# Patient Record
Sex: Female | Born: 1985 | Race: Black or African American | Hispanic: No | Marital: Single | Smoking: Never smoker
Health system: Southern US, Community
[De-identification: ages and names within clinical notes are randomized; demographics above are authoritative.]

## PROBLEM LIST (undated history)

## (undated) DIAGNOSIS — R51 Headache: Secondary | ICD-10-CM

## (undated) DIAGNOSIS — K649 Unspecified hemorrhoids: Secondary | ICD-10-CM

## (undated) DIAGNOSIS — R011 Cardiac murmur, unspecified: Secondary | ICD-10-CM

## (undated) DIAGNOSIS — B9689 Other specified bacterial agents as the cause of diseases classified elsewhere: Secondary | ICD-10-CM

## (undated) DIAGNOSIS — R569 Unspecified convulsions: Secondary | ICD-10-CM

## (undated) DIAGNOSIS — N760 Acute vaginitis: Secondary | ICD-10-CM

## (undated) DIAGNOSIS — N611 Abscess of the breast and nipple: Secondary | ICD-10-CM

## (undated) HISTORY — DX: Unspecified convulsions: R56.9

## (undated) HISTORY — PX: INCISE AND DRAIN ABCESS: PRO64

## (undated) HISTORY — PX: WISDOM TOOTH EXTRACTION: SHX21

## (undated) HISTORY — DX: Abscess of the breast and nipple: N61.1

## (undated) HISTORY — DX: Cardiac murmur, unspecified: R01.1

## (undated) HISTORY — PX: BREAST EXCISIONAL BIOPSY: SUR124

---
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
## 2004-03-16 ENCOUNTER — Encounter: Admission: RE | Admit: 2004-03-16 | Discharge: 2004-03-16 | Payer: Self-pay | Admitting: Family Medicine

## 2004-04-05 ENCOUNTER — Encounter (INDEPENDENT_AMBULATORY_CARE_PROVIDER_SITE_OTHER): Payer: Self-pay | Admitting: *Deleted

## 2004-04-05 LAB — CONVERTED CEMR LAB

## 2004-04-25 ENCOUNTER — Other Ambulatory Visit: Admission: RE | Admit: 2004-04-25 | Discharge: 2004-04-25 | Payer: Self-pay | Admitting: Family Medicine

## 2004-04-25 ENCOUNTER — Ambulatory Visit: Payer: Self-pay | Admitting: Family Medicine

## 2004-04-25 ENCOUNTER — Encounter (INDEPENDENT_AMBULATORY_CARE_PROVIDER_SITE_OTHER): Payer: Self-pay | Admitting: Specialist

## 2004-04-30 ENCOUNTER — Encounter: Admission: RE | Admit: 2004-04-30 | Discharge: 2004-04-30 | Payer: Self-pay | Admitting: Sports Medicine

## 2004-11-20 ENCOUNTER — Emergency Department (HOSPITAL_COMMUNITY): Admission: EM | Admit: 2004-11-20 | Discharge: 2004-11-20 | Payer: Self-pay | Admitting: Family Medicine

## 2005-03-01 ENCOUNTER — Ambulatory Visit: Payer: Self-pay | Admitting: Sports Medicine

## 2005-06-30 ENCOUNTER — Emergency Department (HOSPITAL_COMMUNITY): Admission: EM | Admit: 2005-06-30 | Discharge: 2005-06-30 | Payer: Self-pay | Admitting: Family Medicine

## 2006-04-02 ENCOUNTER — Emergency Department (HOSPITAL_COMMUNITY): Admission: EM | Admit: 2006-04-02 | Discharge: 2006-04-02 | Payer: Self-pay | Admitting: Family Medicine

## 2006-06-25 ENCOUNTER — Emergency Department (HOSPITAL_COMMUNITY): Admission: EM | Admit: 2006-06-25 | Discharge: 2006-06-25 | Payer: Self-pay | Admitting: Family Medicine

## 2006-07-07 ENCOUNTER — Inpatient Hospital Stay (HOSPITAL_COMMUNITY): Admission: AD | Admit: 2006-07-07 | Discharge: 2006-07-07 | Payer: Self-pay | Admitting: Obstetrics & Gynecology

## 2006-10-02 DIAGNOSIS — G44209 Tension-type headache, unspecified, not intractable: Secondary | ICD-10-CM

## 2006-10-02 DIAGNOSIS — N751 Abscess of Bartholin's gland: Secondary | ICD-10-CM

## 2006-10-02 DIAGNOSIS — N879 Dysplasia of cervix uteri, unspecified: Secondary | ICD-10-CM | POA: Insufficient documentation

## 2006-10-02 DIAGNOSIS — H1045 Other chronic allergic conjunctivitis: Secondary | ICD-10-CM | POA: Insufficient documentation

## 2006-10-03 ENCOUNTER — Encounter (INDEPENDENT_AMBULATORY_CARE_PROVIDER_SITE_OTHER): Payer: Self-pay | Admitting: *Deleted

## 2007-03-15 ENCOUNTER — Emergency Department (HOSPITAL_COMMUNITY): Admission: EM | Admit: 2007-03-15 | Discharge: 2007-03-15 | Payer: Self-pay | Admitting: Emergency Medicine

## 2007-04-22 ENCOUNTER — Ambulatory Visit (HOSPITAL_COMMUNITY): Admission: RE | Admit: 2007-04-22 | Discharge: 2007-04-22 | Payer: Self-pay | Admitting: Internal Medicine

## 2007-06-16 ENCOUNTER — Emergency Department (HOSPITAL_COMMUNITY): Admission: EM | Admit: 2007-06-16 | Discharge: 2007-06-16 | Payer: Self-pay | Admitting: Emergency Medicine

## 2007-09-27 ENCOUNTER — Emergency Department (HOSPITAL_COMMUNITY): Admission: EM | Admit: 2007-09-27 | Discharge: 2007-09-27 | Payer: Self-pay | Admitting: Family Medicine

## 2007-11-09 ENCOUNTER — Emergency Department (HOSPITAL_COMMUNITY): Admission: EM | Admit: 2007-11-09 | Discharge: 2007-11-09 | Payer: Self-pay | Admitting: Emergency Medicine

## 2007-11-26 ENCOUNTER — Emergency Department (HOSPITAL_COMMUNITY): Admission: EM | Admit: 2007-11-26 | Discharge: 2007-11-26 | Payer: Self-pay | Admitting: Emergency Medicine

## 2008-01-12 ENCOUNTER — Emergency Department (HOSPITAL_COMMUNITY): Admission: EM | Admit: 2008-01-12 | Discharge: 2008-01-12 | Payer: Self-pay | Admitting: Family Medicine

## 2008-05-16 ENCOUNTER — Emergency Department (HOSPITAL_COMMUNITY): Admission: EM | Admit: 2008-05-16 | Discharge: 2008-05-16 | Payer: Self-pay | Admitting: Emergency Medicine

## 2008-07-24 ENCOUNTER — Emergency Department (HOSPITAL_COMMUNITY): Admission: EM | Admit: 2008-07-24 | Discharge: 2008-07-24 | Payer: Self-pay | Admitting: Family Medicine

## 2008-09-02 ENCOUNTER — Emergency Department (HOSPITAL_COMMUNITY): Admission: EM | Admit: 2008-09-02 | Discharge: 2008-09-02 | Payer: Self-pay | Admitting: Family Medicine

## 2009-02-17 ENCOUNTER — Emergency Department (HOSPITAL_COMMUNITY): Admission: EM | Admit: 2009-02-17 | Discharge: 2009-02-17 | Payer: Self-pay | Admitting: Emergency Medicine

## 2009-03-21 ENCOUNTER — Emergency Department (HOSPITAL_COMMUNITY): Admission: EM | Admit: 2009-03-21 | Discharge: 2009-03-21 | Payer: Self-pay | Admitting: Emergency Medicine

## 2010-01-30 ENCOUNTER — Emergency Department (HOSPITAL_COMMUNITY): Admission: EM | Admit: 2010-01-30 | Discharge: 2010-01-30 | Payer: Self-pay | Admitting: Emergency Medicine

## 2010-03-20 ENCOUNTER — Emergency Department (HOSPITAL_COMMUNITY): Admission: EM | Admit: 2010-03-20 | Discharge: 2010-03-20 | Payer: Self-pay | Admitting: Family Medicine

## 2010-03-26 ENCOUNTER — Emergency Department (HOSPITAL_COMMUNITY): Admission: EM | Admit: 2010-03-26 | Discharge: 2010-03-26 | Payer: Self-pay | Admitting: Emergency Medicine

## 2010-10-18 LAB — POCT URINALYSIS DIPSTICK
Glucose, UA: NEGATIVE mg/dL
Ketones, ur: NEGATIVE mg/dL
Protein, ur: 100 mg/dL — AB
Specific Gravity, Urine: 1.02 (ref 1.005–1.030)
Urobilinogen, UA: 0.2 mg/dL (ref 0.0–1.0)
pH: 6 (ref 5.0–8.0)

## 2010-10-18 LAB — GC/CHLAMYDIA PROBE AMP, GENITAL
Chlamydia, DNA Probe: NEGATIVE
GC Probe Amp, Genital: NEGATIVE

## 2010-11-10 LAB — WET PREP, GENITAL
Trich, Wet Prep: NONE SEEN
Yeast Wet Prep HPF POC: NONE SEEN

## 2010-11-10 LAB — GC/CHLAMYDIA PROBE AMP, GENITAL
Chlamydia, DNA Probe: NEGATIVE
GC Probe Amp, Genital: NEGATIVE

## 2010-11-10 LAB — POCT PREGNANCY, URINE: Preg Test, Ur: NEGATIVE

## 2010-11-10 LAB — POCT URINALYSIS DIP (DEVICE): Glucose, UA: NEGATIVE mg/dL

## 2010-11-11 LAB — WET PREP, GENITAL: Yeast Wet Prep HPF POC: NONE SEEN

## 2010-11-11 LAB — POCT URINALYSIS DIP (DEVICE)
Bilirubin Urine: NEGATIVE
Hgb urine dipstick: NEGATIVE
Specific Gravity, Urine: >=1.03 (ref 1.005–1.030)
Urobilinogen, UA: 1 mg/dL (ref 0.0–1.0)
pH: 5.5 (ref 5.0–8.0)

## 2010-11-19 LAB — WET PREP, GENITAL: Trich, Wet Prep: NONE SEEN

## 2010-11-19 LAB — POCT URINALYSIS DIP (DEVICE)
Bilirubin Urine: NEGATIVE
Hgb urine dipstick: NEGATIVE
Urobilinogen, UA: 0.2 mg/dL (ref 0.0–1.0)
pH: 5.5 (ref 5.0–8.0)

## 2011-04-26 LAB — WET PREP, GENITAL
Trich, Wet Prep: NONE SEEN
Yeast Wet Prep HPF POC: NONE SEEN

## 2011-04-26 LAB — POCT URINALYSIS DIP (DEVICE)
Nitrite: NEGATIVE
Operator id: 235561
Protein, ur: 30 — AB

## 2011-04-26 LAB — POCT PREGNANCY, URINE
Operator id: 235561
Preg Test, Ur: NEGATIVE

## 2011-04-26 LAB — GC/CHLAMYDIA PROBE AMP, GENITAL: GC Probe Amp, Genital: NEGATIVE

## 2011-04-30 LAB — POCT PREGNANCY, URINE
Operator id: 126491
Operator id: 239701
Preg Test, Ur: NEGATIVE

## 2011-04-30 LAB — POCT URINALYSIS DIP (DEVICE)
Bilirubin Urine: NEGATIVE
Glucose, UA: NEGATIVE
Glucose, UA: NEGATIVE
Ketones, ur: NEGATIVE
Protein, ur: NEGATIVE
Specific Gravity, Urine: 1.02
Urobilinogen, UA: 1
pH: 6

## 2011-04-30 LAB — GC/CHLAMYDIA PROBE AMP, GENITAL: Chlamydia, DNA Probe: NEGATIVE

## 2011-04-30 LAB — WET PREP, GENITAL: Trich, Wet Prep: NONE SEEN

## 2011-04-30 LAB — URINE CULTURE: Colony Count: >=100000

## 2011-05-02 LAB — POCT PREGNANCY, URINE
Operator id: 247071
Preg Test, Ur: NEGATIVE

## 2011-05-02 LAB — POCT URINALYSIS DIP (DEVICE)
Glucose, UA: NEGATIVE
Nitrite: NEGATIVE
Operator id: 247071
Protein, ur: NEGATIVE
Specific Gravity, Urine: 1.025
Urobilinogen, UA: 0.2

## 2011-05-02 LAB — WET PREP, GENITAL
Trich, Wet Prep: NONE SEEN
Yeast Wet Prep HPF POC: NONE SEEN

## 2011-05-06 LAB — WET PREP, GENITAL: Yeast Wet Prep HPF POC: NONE SEEN

## 2011-05-06 LAB — POCT URINALYSIS DIP (DEVICE)
Bilirubin Urine: NEGATIVE
Glucose, UA: NEGATIVE
Hgb urine dipstick: NEGATIVE
Ketones, ur: 40 — AB
Operator id: 235561
Specific Gravity, Urine: >=1.03

## 2011-05-06 LAB — GC/CHLAMYDIA PROBE AMP, GENITAL
Chlamydia, DNA Probe: NEGATIVE
GC Probe Amp, Genital: POSITIVE — AB

## 2011-05-09 LAB — POCT URINALYSIS DIP (DEVICE)
Hgb urine dipstick: NEGATIVE
Ketones, ur: NEGATIVE mg/dL
Nitrite: NEGATIVE
Protein, ur: 30 mg/dL — AB
pH: 5.5 (ref 5.0–8.0)

## 2011-05-09 LAB — URINE CULTURE: Colony Count: 45000

## 2011-05-09 LAB — GC/CHLAMYDIA PROBE AMP, GENITAL
Chlamydia, DNA Probe: NEGATIVE
GC Probe Amp, Genital: NEGATIVE

## 2011-05-09 LAB — WET PREP, GENITAL: Yeast Wet Prep HPF POC: NONE SEEN

## 2011-05-14 LAB — WET PREP, GENITAL
Trich, Wet Prep: NONE SEEN
Yeast Wet Prep HPF POC: NONE SEEN

## 2011-05-14 LAB — GC/CHLAMYDIA PROBE AMP, GENITAL
Chlamydia, DNA Probe: NEGATIVE
GC Probe Amp, Genital: NEGATIVE

## 2011-05-20 LAB — POCT URINALYSIS DIP (DEVICE)
Hgb urine dipstick: NEGATIVE
Operator id: 116291
Protein, ur: 100 — AB
Specific Gravity, Urine: >=1.03
Urobilinogen, UA: 1
pH: 6

## 2011-05-20 LAB — WET PREP, GENITAL

## 2011-05-20 LAB — GC/CHLAMYDIA PROBE AMP, GENITAL: GC Probe Amp, Genital: NEGATIVE

## 2012-03-26 ENCOUNTER — Encounter (HOSPITAL_COMMUNITY): Payer: Self-pay | Admitting: *Deleted

## 2012-03-26 ENCOUNTER — Encounter (INDEPENDENT_AMBULATORY_CARE_PROVIDER_SITE_OTHER): Payer: Self-pay | Admitting: Surgery

## 2012-03-26 ENCOUNTER — Ambulatory Visit (INDEPENDENT_AMBULATORY_CARE_PROVIDER_SITE_OTHER): Payer: Managed Care, Other (non HMO) | Admitting: Surgery

## 2012-03-26 VITALS — BP 130/84 | HR 90 | Temp 98.9°F | Ht 67.0 in | Wt 184.0 lb

## 2012-03-26 DIAGNOSIS — N61 Mastitis without abscess: Secondary | ICD-10-CM

## 2012-03-26 DIAGNOSIS — N611 Abscess of the breast and nipple: Secondary | ICD-10-CM | POA: Insufficient documentation

## 2012-03-26 NOTE — Progress Notes (Signed)
NAMEFIONNA Martin DOB: [birth year] MRN: [id number]                                                                                      DATE: 03/26/2012  PCP:  Referring Provider: [facility]  IMPRESSION:  Right breast abscess  PLAN:   Incision and drainage under anesthesia tomorrow. I offered to do this under local anesthesia in the office today but the patient adamantly declined. Therefore we will do it tomorrow under anesthesia.                 CC:  Chief Complaint  Patient presents with  . Abscess    breast     HPI:  Jean Martin is a 26 y.o.  female who presents for evaluation of A right breast abscess. She thinks it's been developing since Monday. She has had abscesses drained on her thigh and on her pilonidal area all under local in all very painful to her.She started on some Septra antibiotic and thinks perhaps as little bit better and has spontaneously drained a small amount. PMH:  has a past medical history of Seizures and Heart murmur.  PSH:   has no past surgical history on file.  ALLERGIES:  No Known Allergies  MEDICATIONS: Current outpatient prescriptions:sulfamethoxazole-trimethoprim (BACTRIM DS) 800-160 MG per tablet, Take 1 tablet by mouth 2 (two) times daily., Disp: , Rfl:   ROS: She has filled out our 12 point review of systems and it is negative Except for as noted in history of present illness. EXAM:   VITAL SIGNS: BP 130/84  Pulse 90  Temp 98.9 F (37.2 C) (Temporal)  Ht 5\' 7"  (1.702 m)  Wt 184 lb (83.462 kg)  BMI 28.82 kg/m2 GENERAL:  The patient is alert, oriented, and generally healthy-appearing, NAD. Mood and affect are normal.  HEENT:  The head is normocephalic, the eyes nonicteric, the pupils were round regular and equal. EOMs are normal. Pharynx normal. Dentition good.  NECK:  The neck is supple and there are no masses or thyromegaly.  LUNGS: Normal respirations and clear to auscultation.  HEART: Regular rhythm, with no  murmurs rubs or gallops. Pulses are intact carotid dorsalis pedis and posterior tibial. No significant varicosities are noted.  BREASTS:  The right breast has a fluctuant area at about the 6:00 position. There some purulent material draining. The area of fluctuance measures about 2.5 cm. There is overlying erythema and some Epidermolysis.  ABDOMEN: Soft, flat, and nontender. No masses or organomegaly is noted. No hernias are noted. Bowel sounds are normal.  EXTREMITIES:  Good range of motion, no edema.   DATA REVIEWED:  None    Aileana Hodder J 03/26/2012  CC: [facility]

## 2012-03-26 NOTE — Patient Instructions (Signed)
We will plan to drain the abscess tomorrow in the operating room under anesthesia. You will need someone to take you home and stay overnight with you

## 2012-03-27 ENCOUNTER — Ambulatory Visit (HOSPITAL_COMMUNITY): Payer: Managed Care, Other (non HMO) | Admitting: Anesthesiology

## 2012-03-27 ENCOUNTER — Encounter (HOSPITAL_COMMUNITY)
Admission: RE | Disposition: A | Discharge status: Home / Self Care | Payer: Self-pay | Source: Ambulatory Visit | Attending: Surgery

## 2012-03-27 ENCOUNTER — Encounter (HOSPITAL_COMMUNITY): Payer: Self-pay | Admitting: *Deleted

## 2012-03-27 ENCOUNTER — Encounter (HOSPITAL_COMMUNITY): Payer: Self-pay | Admitting: Anesthesiology

## 2012-03-27 ENCOUNTER — Ambulatory Visit (HOSPITAL_COMMUNITY)
Admission: RE | Admit: 2012-03-27 | Discharge: 2012-03-27 | Disposition: A | Discharge status: Home / Self Care | Payer: Managed Care, Other (non HMO) | Source: Ambulatory Visit | Attending: Surgery | Admitting: Surgery

## 2012-03-27 DIAGNOSIS — N61 Mastitis without abscess: Secondary | ICD-10-CM | POA: Insufficient documentation

## 2012-03-27 DIAGNOSIS — R51 Headache: Secondary | ICD-10-CM | POA: Insufficient documentation

## 2012-03-27 DIAGNOSIS — R569 Unspecified convulsions: Secondary | ICD-10-CM | POA: Insufficient documentation

## 2012-03-27 HISTORY — DX: Unspecified hemorrhoids: K64.9

## 2012-03-27 HISTORY — DX: Headache: R51

## 2012-03-27 LAB — SURGICAL PCR SCREEN
MRSA, PCR: NEGATIVE
Staphylococcus aureus: NEGATIVE

## 2012-03-27 LAB — CBC
HCT: 36.4 % (ref 36.0–46.0)
Hemoglobin: 12.3 g/dL (ref 12.0–15.0)
MCHC: 33.8 g/dL (ref 30.0–36.0)
RDW: 12.6 % (ref 11.5–15.5)
WBC: 10 10*3/uL (ref 4.0–10.5)

## 2012-03-27 LAB — HCG, SERUM, QUALITATIVE: Preg, Serum: NEGATIVE

## 2012-03-27 SURGERY — INCISION AND DRAINAGE, ABSCESS
Anesthesia: Choice | Laterality: Right | Wound class: Dirty or Infected

## 2012-03-27 MED ORDER — BUPIVACAINE HCL (PF) 0.25 % IJ SOLN
INTRAMUSCULAR | Status: AC
  Filled 2012-03-27: qty 30

## 2012-03-27 MED ORDER — CEFAZOLIN SODIUM-DEXTROSE 2-3 GM-% IV SOLR
INTRAVENOUS | Status: AC
  Administered 2012-03-27: 2 g via INTRAVENOUS
  Filled 2012-03-27: qty 50

## 2012-03-27 MED ORDER — BUPIVACAINE-EPINEPHRINE 0.25% -1:200000 IJ SOLN
INTRAMUSCULAR | Status: DC | PRN
  Administered 2012-03-27: 10 mL

## 2012-03-27 MED ORDER — HYDROCODONE-ACETAMINOPHEN 5-325 MG PO TABS
ORAL_TABLET | ORAL | Status: AC
  Filled 2012-03-27: qty 1

## 2012-03-27 MED ORDER — CEFAZOLIN SODIUM-DEXTROSE 2-3 GM-% IV SOLR: 2.0000 g | INTRAVENOUS | Status: DC

## 2012-03-27 MED ORDER — ONDANSETRON HCL 4 MG/2ML IJ SOLN
INTRAMUSCULAR | Status: DC | PRN
  Administered 2012-03-27: 4 mg via INTRAVENOUS

## 2012-03-27 MED ORDER — LIDOCAINE HCL (CARDIAC) 20 MG/ML IV SOLN
INTRAVENOUS | Status: DC | PRN
  Administered 2012-03-27: 50 mg via INTRAVENOUS

## 2012-03-27 MED ORDER — HYDROCODONE-ACETAMINOPHEN 5-325 MG PO TABS
2.0000 | ORAL_TABLET | Freq: Once | ORAL | Status: AC
  Administered 2012-03-27: 1 via ORAL

## 2012-03-27 MED ORDER — HYDROCODONE-ACETAMINOPHEN 5-325 MG PO TABS: 1.0000 | ORAL_TABLET | ORAL | Status: AC | PRN

## 2012-03-27 MED ORDER — LACTATED RINGERS IV SOLN
INTRAVENOUS | Status: DC | PRN
  Administered 2012-03-27: 07:00:00 via INTRAVENOUS

## 2012-03-27 MED ORDER — FENTANYL CITRATE 0.05 MG/ML IJ SOLN
INTRAMUSCULAR | Status: DC | PRN
  Administered 2012-03-27 (×2): 100 ug via INTRAVENOUS
  Administered 2012-03-27: 50 ug via INTRAVENOUS

## 2012-03-27 MED ORDER — MUPIROCIN 2 % EX OINT: TOPICAL_OINTMENT | Freq: Two times a day (BID) | CUTANEOUS | Status: DC

## 2012-03-27 MED ORDER — PROPOFOL 10 MG/ML IV BOLUS
INTRAVENOUS | Status: DC | PRN
  Administered 2012-03-27: 200 mg via INTRAVENOUS

## 2012-03-27 MED ORDER — MUPIROCIN 2 % EX OINT
TOPICAL_OINTMENT | CUTANEOUS | Status: AC
  Filled 2012-03-27: qty 22

## 2012-03-27 SURGICAL SUPPLY — 28 items
BANDAGE GAUZE ELAST BULKY 4 IN (GAUZE/BANDAGES/DRESSINGS) IMPLANT
CANISTER SUCTION 2500CC (MISCELLANEOUS) ×2 IMPLANT
CLOTH BEACON ORANGE TIMEOUT ST (SAFETY) ×2 IMPLANT
COVER SURGICAL LIGHT HANDLE (MISCELLANEOUS) ×4 IMPLANT
DRAPE LAPAROSCOPIC ABDOMINAL (DRAPES) ×2 IMPLANT
DRAPE UTILITY 15X26 W/TAPE STR (DRAPE) ×4 IMPLANT
DRSG PAD ABDOMINAL 8X10 ST (GAUZE/BANDAGES/DRESSINGS) IMPLANT
ELECT CAUTERY BLADE 6.4 (BLADE) ×2 IMPLANT
ELECT REM PT RETURN 9FT ADLT (ELECTROSURGICAL) ×2
ELECTRODE REM PT RTRN 9FT ADLT (ELECTROSURGICAL) ×1 IMPLANT
GAUZE SPONGE 2X2 8PLY STRL LF (GAUZE/BANDAGES/DRESSINGS) ×1 IMPLANT
GLOVE BIO SURGEON STRL SZ7.5 (GLOVE) ×2 IMPLANT
GOWN STRL NON-REIN LRG LVL3 (GOWN DISPOSABLE) ×4 IMPLANT
KIT BASIN OR (CUSTOM PROCEDURE TRAY) ×2 IMPLANT
KIT ROOM TURNOVER OR (KITS) ×2 IMPLANT
NEEDLE HYPO 25GX1X1/2 BEV (NEEDLE) ×2 IMPLANT
NS IRRIG 1000ML POUR BTL (IV SOLUTION) ×2 IMPLANT
PACK GENERAL/GYN (CUSTOM PROCEDURE TRAY) ×2 IMPLANT
PAD ARMBOARD 7.5X6 YLW CONV (MISCELLANEOUS) ×4 IMPLANT
SPECIMEN JAR SMALL (MISCELLANEOUS) ×2 IMPLANT
SPONGE GAUZE 2X2 STER 10/PKG (GAUZE/BANDAGES/DRESSINGS) ×1
SPONGE GAUZE 4X4 12PLY (GAUZE/BANDAGES/DRESSINGS) ×2 IMPLANT
SWAB COLLECTION DEVICE MRSA (MISCELLANEOUS) ×4 IMPLANT
SYR CONTROL 10ML LL (SYRINGE) ×2 IMPLANT
TAPE CLOTH SURG 6X10 WHT LF (GAUZE/BANDAGES/DRESSINGS) ×2 IMPLANT
TOWEL OR 17X24 6PK STRL BLUE (TOWEL DISPOSABLE) ×2 IMPLANT
TOWEL OR 17X26 10 PK STRL BLUE (TOWEL DISPOSABLE) ×2 IMPLANT
TUBE ANAEROBIC SPECIMEN COL (MISCELLANEOUS) IMPLANT

## 2012-03-27 NOTE — Anesthesia Preprocedure Evaluation (Addendum)
Anesthesia Evaluation  Patient identified by MRN, date of birth, ID band Patient awake    Reviewed: Allergy & Precautions, H&P , NPO status , Patient's Chart, lab work & pertinent test results, reviewed documented beta blocker date and time   Airway Mallampati: I TM Distance: >3 FB Neck ROM: full    Dental  (+) Teeth Intact and Dental Advisory Given   Pulmonary          Cardiovascular Rhythm:regular Rate:Normal     Neuro/Psych  Headaches, Seizures -,     GI/Hepatic   Endo/Other    Renal/GU      Musculoskeletal   Abdominal   Peds  Hematology   Anesthesia Other Findings   Reproductive/Obstetrics                          Anesthesia Physical Anesthesia Plan  ASA: II  Anesthesia Plan: General   Post-op Pain Management:    Induction: Intravenous  Airway Management Planned: Oral ETT and LMA  Additional Equipment:   Intra-op Plan:   Post-operative Plan: Extubation in OR  Informed Consent: I have reviewed the patients History and Physical, chart, labs and discussed the procedure including the risks, benefits and alternatives for the proposed anesthesia with the patient or authorized representative who has indicated his/her understanding and acceptance.   Dental advisory given  Plan Discussed with: CRNA, Anesthesiologist and Surgeon  Anesthesia Plan Comments:        Anesthesia Quick Evaluation

## 2012-03-27 NOTE — Transfer of Care (Cosign Needed)
Immediate Anesthesia Transfer of Care Note  Patient: Jean Martin  Procedure(s) Performed: Procedure(s) (LRB): INCISION AND DRAINAGE ABSCESS (Right)  Patient Location: PACU  Anesthesia Type: General  Level of Consciousness: awake, alert  and oriented  Airway & Oxygen Therapy: Patient Spontanous Breathing  Post-op Assessment: Report given to PACU RN and Post -op Vital signs reviewed and stable  Post vital signs: Reviewed and stable  Complications: No apparent anesthesia complications

## 2012-03-27 NOTE — Preoperative (Signed)
Beta Blockers   Reason not to administer Beta Blockers:Not Applicable 

## 2012-03-27 NOTE — Op Note (Signed)
Jean Martin 1986-05-06 [id number] 03/26/2012  Preoperative diagnosis: Right breast abscess   Postoperative diagnosis: Same  Procedure: I&D of breast abscess with biopsy of abscess wall  Surgeon: Currie Paris, MD, [facility]  Anesthesia: General   Clinical History and Indications: This patient presented to our office and she was a newly developed right breast abscess. She had already been placed on Septra by her primary physician. I thought this needed to be opened and drained. However she declined to have anything done under local so she is scheduled today for outpatient drainage of her breast abscess under general anesthesia.    Description of Procedure: Also the patient apparently very and confirmed the plans. The right breast as marked as the operative site. The patient was taken to the operating room and after satisfactory general anesthesia had been obtained the right breast was prepped and draped in a time out was done.  The skin overlying the abscess was red and soft underneath with some purulent drainage out of a center puncture wound area.I made an elliptical incision to take off this area skin and small abscess cavity. There's a lot of chronic inflammation present and only a small amount of frank pus. I took some biopsies of the abscess wall cavity in nature he then was cleaned out. There didn't appear to be any residual abscess.  Cultures were taken. The abscess cavity was packed with moistened 2 x 2's. I did 0.25% plain Marcaine for postop pain.  The patient tolerated the procedure well. There no operative complications. Counts were correct.  Currie Paris, MD, FACS 03/27/2012 7:44 AM

## 2012-03-27 NOTE — Anesthesia Postprocedure Evaluation (Signed)
  Anesthesia Post-op Note  Patient: Jean Martin  Procedure(s) Performed: Procedure(s) (LRB): INCISION AND DRAINAGE ABSCESS (Right)  Patient Location: PACU  Anesthesia Type: General  Level of Consciousness: awake, alert , oriented and patient cooperative  Airway and Oxygen Therapy: Patient Spontanous Breathing and Patient connected to nasal cannula oxygen  Post-op Pain: mild  Post-op Assessment: Post-op Vital signs reviewed, Patient's Cardiovascular Status Stable, Respiratory Function Stable, Patent Airway, No signs of Nausea or vomiting and Pain level controlled  Post-op Vital Signs: stable  Complications: No apparent anesthesia complications

## 2012-03-27 NOTE — H&P (View-Only) (Signed)
NAME: Jean Martin DOB: 08/03/1986 MRN: [id number]                                                                                      DATE: 03/26/2012  PCP:  Referring Provider: [facility]  IMPRESSION:  Right breast abscess  PLAN:   Incision and drainage under anesthesia tomorrow. I offered to do this under local anesthesia in the office today but the patient adamantly declined. Therefore we will do it tomorrow under anesthesia.                 CC:  Chief Complaint  Patient presents with  . Abscess    breast     HPI:  Jean Martin is a 26 y.o.  female who presents for evaluation of A right breast abscess. She thinks it's been developing since Monday. She has had abscesses drained on her thigh and on her pilonidal area all under local in all very painful to her.She started on some Septra antibiotic and thinks perhaps as little bit better and has spontaneously drained a small amount. PMH:  has a past medical history of Seizures and Heart murmur.  PSH:   has no past surgical history on file.  ALLERGIES:  No Known Allergies  MEDICATIONS: Current outpatient prescriptions:sulfamethoxazole-trimethoprim (BACTRIM DS) 800-160 MG per tablet, Take 1 tablet by mouth 2 (two) times daily., Disp: , Rfl:   ROS: She has filled out our 12 point review of systems and it is negative Except for as noted in history of present illness. EXAM:   VITAL SIGNS: BP 130/84  Pulse 90  Temp 98.9 F (37.2 C) (Temporal)  Ht 5' 7" (1.702 m)  Wt 184 lb (83.462 kg)  BMI 28.82 kg/m2 GENERAL:  The patient is alert, oriented, and generally healthy-appearing, NAD. Mood and affect are normal.  HEENT:  The head is normocephalic, the eyes nonicteric, the pupils were round regular and equal. EOMs are normal. Pharynx normal. Dentition good.  NECK:  The neck is supple and there are no masses or thyromegaly.  LUNGS: Normal respirations and clear to auscultation.  HEART: Regular rhythm, with no  murmurs rubs or gallops. Pulses are intact carotid dorsalis pedis and posterior tibial. No significant varicosities are noted.  BREASTS:  The right breast has a fluctuant area at about the 6:00 position. There some purulent material draining. The area of fluctuance measures about 2.5 cm. There is overlying erythema and some Epidermolysis.  ABDOMEN: Soft, flat, and nontender. No masses or organomegaly is noted. No hernias are noted. Bowel sounds are normal.  EXTREMITIES:  Good range of motion, no edema.   DATA REVIEWED:  None    Jaleiah Asay J 03/26/2012  CC: [facility]       

## 2012-03-27 NOTE — Interval H&P Note (Signed)
History and Physical Interval Note:  03/27/2012 7:11 AM  Jean Martin  has presented today for surgery, with the diagnosis of Right breast abscess  The various methods of treatment have been discussed with the patient and family. After consideration of risks, benefits and other options for treatment, the patient has consented to  Procedure(s) (LRB): INCISION AND DRAINAGE ABSCESS (Right) as a surgical intervention .  The patient's history has been reviewed, patient examined, no change in status, stable for surgery.  I have reviewed the patient's chart and labs.  Questions were answered to the patient's satisfaction.     Hayslee Casebolt J  Right breast is marked as the operative side

## 2012-03-27 NOTE — Progress Notes (Signed)
Dr Katrinka Blazing aware pt requesting a vicodin before discharge.  Order received may give  1 - 2 vicodin in pacu.

## 2012-03-30 ENCOUNTER — Telehealth (INDEPENDENT_AMBULATORY_CARE_PROVIDER_SITE_OTHER): Payer: Self-pay

## 2012-03-30 ENCOUNTER — Ambulatory Visit (INDEPENDENT_AMBULATORY_CARE_PROVIDER_SITE_OTHER): Payer: Managed Care, Other (non HMO)

## 2012-03-30 ENCOUNTER — Encounter (INDEPENDENT_AMBULATORY_CARE_PROVIDER_SITE_OTHER): Payer: Self-pay

## 2012-03-30 DIAGNOSIS — N611 Abscess of the breast and nipple: Secondary | ICD-10-CM

## 2012-03-30 DIAGNOSIS — N61 Mastitis without abscess: Secondary | ICD-10-CM

## 2012-03-30 LAB — CULTURE, ROUTINE-ABSCESS
Culture: NO GROWTH
Gram Stain: NONE SEEN

## 2012-03-30 MED FILL — Mupirocin Oint 2%: CUTANEOUS | Qty: 22 | Status: AC

## 2012-03-30 NOTE — Progress Notes (Signed)
Pt came in for nurse only to have breast abscess packing changed per Dr Jamey Ripa. The pt had a breast I&D on 03/27/12 that required packing after surgery. The bandage was removed and the packing removed. I took a 2x2 gauze wet with normal saline and repacked the right breast wound. I covered the breast wound with dry gauze and tape. The pt was asking about repacking the wound and when could she go back to work. The pt does not have a f/u appt scheduled with Dr Jamey Ripa. I advised pt that I would send him a message with her questions and get back in touch with her at a later time. The pt feels she has someone at home to do dressing changes to keep her from coming up to the office everyday.

## 2012-03-30 NOTE — Telephone Encounter (Signed)
Returned pt's call about when to go back to work, how long to keep packing the breast wound, and her f/u appt with Dr Jamey Ripa. I spoke to Dr Dwain Sarna today about the pt since Dr Jamey Ripa is out of the office all week. The pt was advised to keep packing the breast wound for a week till it closes down smaller. The pt was advised that was ok to go back to work tomorrow. I made her a f/u appt to see Dr Jamey Ripa next week when he comes back per Dr Dwain Sarna. The pt understands.

## 2012-04-01 LAB — ANAEROBIC CULTURE

## 2012-04-02 ENCOUNTER — Encounter (HOSPITAL_COMMUNITY): Payer: Self-pay

## 2012-04-08 ENCOUNTER — Ambulatory Visit (INDEPENDENT_AMBULATORY_CARE_PROVIDER_SITE_OTHER): Payer: Managed Care, Other (non HMO) | Admitting: Surgery

## 2012-04-08 ENCOUNTER — Encounter (INDEPENDENT_AMBULATORY_CARE_PROVIDER_SITE_OTHER): Payer: Self-pay | Admitting: Surgery

## 2012-04-08 ENCOUNTER — Encounter (INDEPENDENT_AMBULATORY_CARE_PROVIDER_SITE_OTHER): Payer: Self-pay | Admitting: General Surgery

## 2012-04-08 VITALS — BP 110/62 | HR 68 | Temp 97.6°F | Resp 16 | Ht 67.0 in | Wt 183.6 lb

## 2012-04-08 DIAGNOSIS — Z09 Encounter for follow-up examination after completed treatment for conditions other than malignant neoplasm: Secondary | ICD-10-CM

## 2012-04-08 NOTE — Progress Notes (Signed)
NAME: Jean Martin                                            DOB: [birth year] DATE: 04/08/2012                                                  MRN: [id number]  CC: Post op   HPI: This patient comes in for post op follow-up.Sheunderwent I7D of a right breast abscess on 03/27/12. She feels that she is doing well.  PE:  VITAL SIGNS: BP 110/62  Pulse 68  Temp 97.6 F (36.4 C) (Temporal)  Resp 16  Ht 5\' 7"  (1.702 m)  Wt 183 lb 9.6 oz (83.28 kg)  BMI 28.76 kg/m2  LMP 03/23/2012  General: The patient appears to be healthy, NAD Wound has healthy granulations, starting to close in   IMPRESSION: The patient is doing well S/P I7D.    PLAN: Continue local wound care and rtc in two or three weeks

## 2012-04-08 NOTE — Patient Instructions (Signed)
Continue wound care as you are doing. See me in two or three weeks

## 2012-04-27 ENCOUNTER — Encounter (INDEPENDENT_AMBULATORY_CARE_PROVIDER_SITE_OTHER): Payer: Self-pay | Admitting: General Surgery

## 2012-04-27 ENCOUNTER — Ambulatory Visit (INDEPENDENT_AMBULATORY_CARE_PROVIDER_SITE_OTHER): Payer: Managed Care, Other (non HMO) | Admitting: Surgery

## 2012-04-27 ENCOUNTER — Encounter (INDEPENDENT_AMBULATORY_CARE_PROVIDER_SITE_OTHER): Payer: Self-pay | Admitting: Surgery

## 2012-04-27 VITALS — BP 120/84 | HR 76 | Temp 97.4°F | Resp 18 | Ht 67.0 in | Wt 183.4 lb

## 2012-04-27 DIAGNOSIS — N61 Mastitis without abscess: Secondary | ICD-10-CM

## 2012-04-27 DIAGNOSIS — N611 Abscess of the breast and nipple: Secondary | ICD-10-CM

## 2012-04-27 NOTE — Patient Instructions (Signed)
We will see you again on an as needed basis. Please call the office at [phone] if you have any questions or concerns. Thank you for allowing us to take care of you.  

## 2012-04-27 NOTE — Progress Notes (Signed)
NAME: Jean Martin                                            DOB: 05/28/1986 DATE: 04/27/2012                                                  MRN: [id number]  CC: Post op   HPI: This patient comes in for post op follow-up.Sheunderwent I7D of a right breast abscess on 03/27/12. She feels that she is doing well.  PE:  VITAL SIGNS: BP 120/84  Pulse 76  Temp 97.4 F (36.3 C) (Temporal)  Resp 18  Ht 5\' 7"  (1.702 m)  Wt 183 lb 6.4 oz (83.19 kg)  BMI 28.72 kg/m2  General: The patient appears to be healthy, NAD Wound healed   IMPRESSION: The patient is doing well S/P I&D    PLAN: RTC PRN

## 2012-07-03 ENCOUNTER — Inpatient Hospital Stay (HOSPITAL_COMMUNITY)
Admission: AD | Admit: 2012-07-03 | Discharge: 2012-07-03 | Disposition: A | Discharge status: Home / Self Care | Payer: Managed Care, Other (non HMO) | Source: Ambulatory Visit | Attending: Family Medicine | Admitting: Family Medicine

## 2012-07-03 ENCOUNTER — Encounter (HOSPITAL_COMMUNITY): Payer: Self-pay

## 2012-07-03 DIAGNOSIS — B354 Tinea corporis: Secondary | ICD-10-CM | POA: Insufficient documentation

## 2012-07-03 DIAGNOSIS — N644 Mastodynia: Secondary | ICD-10-CM | POA: Insufficient documentation

## 2012-07-03 MED ORDER — CLOTRIMAZOLE 1 % EX CREA: TOPICAL_CREAM | Freq: Two times a day (BID) | CUTANEOUS | Status: DC

## 2012-07-03 NOTE — MAU Note (Signed)
Breast pain, nipple itching first noted about a month ago.   Area below areola is raised, appear irritated.

## 2012-07-03 NOTE — MAU Note (Signed)
Patient is in with c/o right breast tenderness and a "chaffed like " area on her nipple that is raised and itching. She denies having drainage from the breast, or redness or unmoveable bump. She denies any other discomfort,.

## 2012-07-03 NOTE — MAU Provider Note (Signed)
  History     CSN: [id number]  Arrival date and time: 07/03/12 1141   First Provider Initiated Contact with Patient 07/03/12 1342      Chief Complaint  Patient presents with  . Breast Pain   HPI  .Jean Martin is a 26 y.o. G1P0101 who presents today with right nipple pain and and area that is chaffed just under the areola. She states that it is itchy. There are little white bumps along it as well. She has tried neosporin, but it did not help.   Past Medical History  Diagnosis Date  . 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  Last one age of 22.  Marland Kitchen Headache   . Hemorrhoids   . Heart murmur     no longer active.   . Breast abscess     right breast    Past Surgical History  Procedure Date  . Wisdom tooth extraction   . Incise and drain abcess     right breast    History reviewed. No pertinent family history.  History  Substance Use Topics  . Smoking status: Never Smoker   . Smokeless tobacco: Not on file  . Alcohol Use: 0.5 oz/week    1 drink(s) per week     Comment: occiasionally    Allergies: No Known Allergies  Prescriptions prior to admission  Medication Sig Dispense Refill  . Vitamin D, Ergocalciferol, (DRISDOL) 50000 UNITS CAPS Take 50,000 Units by mouth every Friday. Take medication every Friday for four week, currently in week 3.        ROS Physical Exam   Blood pressure 128/73, pulse 52, temperature 98.1 F (36.7 C), temperature source Oral, resp. rate 18, height 5\' 7"  (1.702 m), weight 163 lb (73.936 kg), last menstrual period 06/13/2012.  Physical Exam  Nursing note and vitals reviewed. Constitutional: She is oriented to person, place, and time. She appears well-developed.  Cardiovascular: Normal rate.   Respiratory: Effort normal.  GI: Soft.  Neurological: She is alert and oriented to person, place, and time.  Skin: Skin is warm and dry.       1cmX1cm area of ringworm on right areola.     MAU Course  Procedures    Assessment and Plan   1.  Ringworm of body   Clotrimazole FU with PCP as scheduled.   Tawnya Crook 07/03/2012, 1:43 PM

## 2012-07-03 NOTE — MAU Provider Note (Signed)
Chart reviewed and agree with management and plan.  

## 2012-07-03 NOTE — MAU Note (Signed)
No pain, no bleeding or discharge.  "white stuff" noted in cracks of nipple.

## 2012-07-23 ENCOUNTER — Encounter (HOSPITAL_COMMUNITY): Payer: Self-pay | Admitting: *Deleted

## 2012-07-23 ENCOUNTER — Emergency Department (INDEPENDENT_AMBULATORY_CARE_PROVIDER_SITE_OTHER)
Admission: EM | Admit: 2012-07-23 | Discharge: 2012-07-23 | Disposition: A | Discharge status: Home / Self Care | Payer: Managed Care, Other (non HMO) | Source: Home / Self Care | Attending: Emergency Medicine | Admitting: Emergency Medicine

## 2012-07-23 ENCOUNTER — Other Ambulatory Visit (HOSPITAL_COMMUNITY)
Admission: RE | Admit: 2012-07-23 | Discharge: 2012-07-23 | Disposition: A | Discharge status: Home / Self Care | Payer: Managed Care, Other (non HMO) | Source: Ambulatory Visit | Attending: Emergency Medicine | Admitting: Emergency Medicine

## 2012-07-23 DIAGNOSIS — L989 Disorder of the skin and subcutaneous tissue, unspecified: Secondary | ICD-10-CM

## 2012-07-23 DIAGNOSIS — Z113 Encounter for screening for infections with a predominantly sexual mode of transmission: Secondary | ICD-10-CM | POA: Insufficient documentation

## 2012-07-23 DIAGNOSIS — N760 Acute vaginitis: Secondary | ICD-10-CM

## 2012-07-23 DIAGNOSIS — J358 Other chronic diseases of tonsils and adenoids: Secondary | ICD-10-CM

## 2012-07-23 HISTORY — DX: Acute vaginitis: N76.0

## 2012-07-23 HISTORY — DX: Other specified bacterial agents as the cause of diseases classified elsewhere: B96.89

## 2012-07-23 LAB — POCT PREGNANCY, URINE: Preg Test, Ur: NEGATIVE

## 2012-07-23 LAB — POCT RAPID STREP A: Streptococcus, Group A Screen (Direct): NEGATIVE

## 2012-07-23 MED ORDER — METRONIDAZOLE 500 MG PO TABS: 500.0000 mg | ORAL_TABLET | Freq: Two times a day (BID) | ORAL | Status: DC

## 2012-07-23 MED ORDER — FLUCONAZOLE 150 MG PO TABS: 150.0000 mg | ORAL_TABLET | Freq: Once | ORAL | Status: DC

## 2012-07-23 NOTE — ED Notes (Signed)
Also requesting eval of skin around right breast areola; has had "scaley" patch that was evaluated (pt was referred to dermatologist but hasn't gone yet), but now has changed.

## 2012-07-23 NOTE — ED Provider Notes (Signed)
Chief Complaint  Patient presents with  . Sore Throat  . Vaginal Discharge    History of Present Illness:   Jean Martin is a 26 year old female who presents today with 3 complaints, vaginal discharge, a white spot on her left tonsil, and a scaly patch on her right areola.  1. Vaginal discharge: She has had a one-week history of a white vaginal discharge with slight odor and itching. She denies any vaginal, vulvar, or pelvic pain. She's had no lower back pain. She has had some urinary frequency but denies urgency, dysuria, or hematuria. She has had no fever, chills, nausea, or vomiting. 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 She is sexually active with consistent use of condoms, however the condom broke during intercourse a few weeks ago and she requested pregnancy testing and STD testing.  2. White spot on tonsil: The patient noticed a white spot on her left tonsil over the past week. She denies any sore throat or pain on swallowing. She's had no fever, chills, adenopathy, nasal congestion, rhinorrhea, or cough.  3. Scaly patch on right nipple: The patient notes a 2 to three-month history of a scaly patch on her right areola. She has been into see her gynecologist about this and her primary care physician. She's tried Lotrimin and hydrocortisone cream without any improvement. She has been referred to a dermatologist but has not gone yet. This is not painful or itchy. She denies any nipple discharge. She's had no breast mass. She had an I&D of an abscess under that breast in August.  Review of Systems:  Other than noted above, the patient denies any of the following symptoms: Systemic:  No fever, chills, sweats, fatigue, or weight loss. GI:  No abdominal pain, nausea, anorexia, vomiting, diarrhea, constipation, melena or hematochezia. GU:  No dysuria, frequency, urgency, hematuria, vaginal discharge, itching, or abnormal vaginal bleeding. Skin:  No rash or itching.  PMFSH:  Past  medical history, family history, social history, meds, and allergies were reviewed.  Physical Exam:   Vital signs:  BP 133/84  Pulse 63  Temp 98.3 F (36.8 C) (Oral)  Resp 16  SpO2 100%  LMP 07/08/2012 General:  Alert, oriented and in no distress. ENT: There is a white spot on the left tonsil consistent with a tonsillar concretion. The tonsils are mildly enlarged but not erythematous. There is no obvious exudate. Neck: No adenopathy or tenderness. Lungs:  Breath sounds clear and equal bilaterally.  No wheezes, rales or rhonchi. Heart:  Regular rhythm.  No gallops or murmers. Breast exam: There is a round, raised, slightly scaly, 1 cm lesion at the 7:00 position on the right areola. This was nontender to palpation and there was no drainage or exudate. No nipple discharge. No breast mass. She has a scar under the breast from a previous I&D. Abdomen:  Soft, flat and non-distended.  No organomegaly or mass.  No tenderness, guarding or rebound.  Bowel sounds normally active. Pelvic exam:  Normal external genitalia. Vaginal mucosa was normal. There was some cervical eversion and some mucoid discharge coming from the cervical os. A small amount of white discharge. There was no pain on cervical motion. Uterus was normal in size and nontender. No adnexal masses or tenderness. Skin:  Clear, warm and dry.  Labs:   Results for orders placed during the hospital encounter of 07/23/12  POCT RAPID STREP A ([facility] ONLY)      Component Value Range   Streptococcus, Group  A Screen (Direct) NEGATIVE  NEGATIVE  POCT PREGNANCY, URINE      Component Value Range   Preg Test, Ur NEGATIVE  NEGATIVE    Other Labs Obtained at Urgent Care Center:  DNA probes for gonorrhea, Chlamydia, Trichomonas, Gardnerella, and Candida were obtained.  Results are pending at this time and we will call about any positive results.    Assessment:  The primary encounter diagnosis was Vaginitis. Diagnoses of Tonsillith and Skin  lesion were also pertinent to this visit.  The vaginal discharge could be due to bacterial vaginosis or to yeast. Will plan to treat for both. Results of DNA probes are pending. The lesion on the tonsil looks like a typical concretion, and I suggested hot salt water gargles and pressure with a Q-tip to remove these. I'm not sure what the lesion on the areola is and I recommended that she see a dermatologist for possible biopsy of the lesion. I told her it was possible that this could be an early sign of her breast cancer, so is important to followup and get this done as soon as possible.  Plan:   1.  The following meds were prescribed:   New Prescriptions   FLUCONAZOLE (DIFLUCAN) 150 MG TABLET    Take 1 tablet (150 mg total) by mouth once.   METRONIDAZOLE (FLAGYL) 500 MG TABLET    Take 1 tablet (500 mg total) by mouth 2 (two) times daily.   2.  The patient was instructed in symptomatic care and handouts were given. 3.  The patient was told to return if becoming worse in any way, if no better in 3 or 4 days, and given some red flag symptoms that would indicate earlier return.  The patient was told that the differential diagnosis includes cancer, and that it was of the utmost importance to followup with the specialist to whom she was referred.     Reuben Likes, MD 07/23/12 [phone]

## 2012-07-23 NOTE — ED Notes (Signed)
C/O vaginal discharge x 1 wk; states seems different than her usual BV.  Denies any abd pain or urinary sxs.  States condom broke during intercourse few weeks ago - wants STD testing and preg test.  Also noticed exudate on tonsils today - requesting strep test.  Denies any sore throat or fevers.

## 2012-07-27 ENCOUNTER — Telehealth (HOSPITAL_COMMUNITY): Payer: Self-pay | Admitting: *Deleted

## 2012-07-27 MED ORDER — AZITHROMYCIN 500 MG PO TABS: 1000.0000 mg | ORAL_TABLET | Freq: Once | ORAL | Status: DC

## 2012-07-27 NOTE — Telephone Encounter (Signed)
Message copied by Arieana Somoza C on Mon Jul 27, 2012  9:33 PM ------      Message from: YORK, SUZANNE M      Created: Mon Jul 27, 2012  9:25 PM       Chlamydia pos. and Gardnerella pos. You treated with Flagyl.  Needs tx. Chlamydia.      York, Suzanne M      07/27/2012       

## 2012-07-27 NOTE — Telephone Encounter (Signed)
Message copied by Reuben Likes on Mon Jul 27, 2012  9:33 PM ------      Message from: [name]      Created: Mon Jul 27, 2012  9:25 PM       Chlamydia pos. and Gardnerella pos. You treated with Flagyl.  Needs tx. Chlamydia.      [name]      07/27/2012

## 2012-07-27 NOTE — ED Notes (Addendum)
Pt. called for her lab results. I called pt. back and left a message for her to call back tomorrow or Thur. after 1300 @ [phone].  GC neg., chlamydia pos., Affirm: Candida neg., Gardnerella pos. and Trich neg.  Message to Dr. Lorenz Coaster about results.   [name] 07/27/2012

## 2012-07-27 NOTE — ED Notes (Signed)
Pt called requesting lab results; pulled results to see if they were back; will give message to Cherly Anderson to call pt back with lab results

## 2012-07-27 NOTE — ED Notes (Signed)
The patient's Gardnerella and Chlamydia came back positive. She was treated with Flagyl. We'll need to treat with azithromycin 1000 mg as a single dose.  Reuben Likes, MD 07/27/12 2133

## 2012-07-28 ENCOUNTER — Telehealth (HOSPITAL_COMMUNITY): Payer: Self-pay | Admitting: Emergency Medicine

## 2012-07-28 NOTE — ED Notes (Signed)
Called pt and notified her of the lab results... Pt verbalized understanding and reported that [facility] ([address]) has already called her in re: to the Azithromycin... Adv pt to take meds and she verbalized understanding.

## 2012-07-30 ENCOUNTER — Telehealth (HOSPITAL_COMMUNITY): Payer: Self-pay | Admitting: *Deleted

## 2012-07-31 ENCOUNTER — Telehealth (HOSPITAL_COMMUNITY): Payer: Self-pay | Admitting: *Deleted

## 2012-07-31 NOTE — ED Notes (Signed)
I called pt.  Pt. called back.  Pt. verified x 2 and given results. States the pharmacy had called her about the Rx. She called in but was confused about her results.  Results clarified.  She was only pos. for Chlamydia and Gardnerella.  Not GC.  Pt. instructed to notify her partner, no sex for 1 week and to practice safe sex. Pt. told she can get HIV testing at the [facility]. STD clinic, by appointment.  DHHS form completed and faxed to the [facility]. [name] 07/31/2012

## 2012-12-28 ENCOUNTER — Encounter: Payer: Self-pay | Admitting: Obstetrics & Gynecology

## 2013-05-13 ENCOUNTER — Ambulatory Visit (INDEPENDENT_AMBULATORY_CARE_PROVIDER_SITE_OTHER): Payer: Managed Care, Other (non HMO) | Admitting: Obstetrics & Gynecology

## 2013-05-13 ENCOUNTER — Encounter: Payer: Self-pay | Admitting: Obstetrics & Gynecology

## 2013-05-13 VITALS — BP 129/72 | HR 61 | Temp 98.1°F | Wt 191.0 lb

## 2013-05-13 DIAGNOSIS — N898 Other specified noninflammatory disorders of vagina: Secondary | ICD-10-CM

## 2013-05-13 MED ORDER — FLUCONAZOLE 150 MG PO TABS: 150.0000 mg | ORAL_TABLET | ORAL | Status: DC

## 2013-05-13 NOTE — Progress Notes (Signed)
Subjective:     Jean Martin is a 27 y.o. female here for a routine exam.  Current complaints: vaginal discharge that is white and yellow. Pt denies any itching odor or irritation. Pt states she also has a area on her left labia. Pt states the area is painful to the touch.   Personal health questionnaire reviewed: yes.   Gynecologic History Patient's last menstrual period was 04/17/2013. Contraception: condoms Last Pap: 06/2012. Results were: normal  Obstetric History OB History  Gravida Para Term Preterm AB SAB TAB Ectopic Multiple Living  1 1  1      1     # Outcome Date GA Lbr Len/2nd Weight Sex Delivery Anes PTL Lv  1 PRE                The following portions of the patient's history were reviewed and updated as appropriate: allergies, current medications, past family history, past medical history, past social history, past surgical history and problem list.  Review of Systems Pertinent items are noted in HPI.    Objective:   Pelvic: Left labia majus--epithelialized, erythematous area < 0.5 cm/NT; SPEC: white vaginal discharge   Assessment:    Furuncle--healed Likely candida vulvovaginitis  Plan:    . Orders Placed This Encounter  Procedures  . POCT Wet Prep (Wet [place])  Diflucan Return prn

## 2013-05-16 ENCOUNTER — Encounter: Payer: Self-pay | Admitting: Obstetrics & Gynecology

## 2013-05-16 LAB — POCT WET PREP (WET MOUNT): Clue Cells Wet Prep Whiff POC: NEGATIVE

## 2013-05-16 NOTE — Patient Instructions (Signed)
Vaginitis Vaginitis is an inflammation of the vagina. It is most often caused by a change in the normal balance of the bacteria and yeast that live in the vagina. This change in balance causes an overgrowth of certain bacteria or yeast, which causes the inflammation. There are different types of vaginitis, but the most common types are:  Bacterial vaginosis.  Yeast infection (candidiasis).  Trichomoniasis vaginitis. This is a sexually transmitted infection (STI).  Viral vaginitis.  Atropic vaginitis.  Allergic vaginitis. CAUSES  The cause depends on the type of vaginitis. Vaginitis can be caused by:  Bacteria (bacterial vaginosis).  Yeast (yeast infection).  A parasite (trichomoniasis vaginitis)  A virus (viral vaginitis).  Low hormone levels (atrophic vaginitis). Low hormone levels can occur during pregnancy, breastfeeding, or after menopause.  Irritants, such as bubble baths, scented tampons, and feminine sprays (allergic vaginitis). Other factors can change the normal balance of the yeast and bacteria that live in the vagina. These include:  Antibiotic medicines.  Poor hygiene.  Diaphragms, vaginal sponges, spermicides, birth control pills, and intrauterine devices (IUD).  Sexual intercourse.  Infection.  Uncontrolled diabetes.  A weakened immune system. SYMPTOMS  Symptoms can vary depending on the cause of the vaginitis. Common symptoms include:  Abnormal vaginal discharge.  The discharge is white, gray, or yellow with bacterial vaginosis.  The discharge is thick, white, and cheesy with a yeast infection.  The discharge is frothy and yellow or greenish with trichomoniasis.  A bad vaginal odor.  The odor is fishy with bacterial vaginosis.  Vaginal itching, pain, or swelling.  Painful intercourse.  Pain or burning when urinating. Sometimes, there are no symptoms. TREATMENT  Treatment will vary depending on the type of infection.   Bacterial  vaginosis and trichomoniasis are often treated with antibiotic creams or pills.  Yeast infections are often treated with antifungal medicines, such as vaginal creams or suppositories.  Viral vaginitis has no cure, but symptoms can be treated with medicines that relieve discomfort. Your sexual partner should be treated as well.  Atrophic vaginitis may be treated with an estrogen cream, pill, suppository, or vaginal ring. If vaginal dryness occurs, lubricants and moisturizing creams may help. You may be told to avoid scented soaps, sprays, or douches.  Allergic vaginitis treatment involves quitting the use of the product that is causing the problem. Vaginal creams can be used to treat the symptoms. HOME CARE INSTRUCTIONS   Take all medicines as directed by your caregiver.  Keep your genital area clean and dry. Avoid soap and only rinse the area with water.  Avoid douching. It can remove the healthy bacteria in the vagina.  Do not use tampons or have sexual intercourse until your vaginitis has been treated. Use sanitary pads while you have vaginitis.  Wipe from front to back. This avoids the spread of bacteria from the rectum to the vagina.  Let air reach your genital area.  Wear cotton underwear to decrease moisture buildup.  Avoid wearing underwear while you sleep until your vaginitis is gone.  Avoid tight pants and underwear or nylons without a cotton panel.  Take off wet clothing (especially bathing suits) as soon as possible.  Use mild, non-scented products. Avoid using irritants, such as:  Scented feminine sprays.  Fabric softeners.  Scented detergents.  Scented tampons.  Scented soaps or bubble baths.  Practice safe sex and use condoms. Condoms may prevent the spread of trichomoniasis and viral vaginitis. SEEK MEDICAL CARE IF:   You have abdominal pain.  You   have a fever or persistent symptoms for more than 2 3 days.  You have a fever and your symptoms suddenly  get worse. Document Released: 05/19/2007 Document Revised: 04/15/2012 Document Reviewed: 01/02/2012 [facility] Patient Information 2014 [place], [place]. Folliculitis  Folliculitis is redness, soreness, and swelling (inflammation) of the hair follicles. This condition can occur anywhere on the body. People with weakened immune systems, diabetes, or obesity have a greater risk of getting folliculitis. CAUSES  Bacterial infection. This is the most common cause.  Fungal infection.  Viral infection.  Contact with certain chemicals, especially oils and tars. Long-term folliculitis can result from bacteria that live in the nostrils. The bacteria may trigger multiple outbreaks of folliculitis over time. SYMPTOMS Folliculitis most commonly occurs on the scalp, thighs, legs, back, buttocks, and areas where hair is shaved frequently. An early sign of folliculitis is a small, white or yellow, pus-filled, itchy lesion (pustule). These lesions appear on a red, inflamed follicle. They are usually less than 0.2 inches (5 mm) wide. When there is an infection of the follicle that goes deeper, it becomes a boil or furuncle. A group of closely packed boils creates a larger lesion (carbuncle). Carbuncles tend to occur in hairy, sweaty areas of the body. DIAGNOSIS  Your caregiver can usually tell what is wrong by doing a physical exam. A sample may be taken from one of the lesions and tested in a lab. This can help determine what is causing your folliculitis. TREATMENT  Treatment may include:  Applying warm compresses to the affected areas.  Taking antibiotic medicines orally or applying them to the skin.  Draining the lesions if they contain a large amount of pus or fluid.  Laser hair removal for cases of long-lasting folliculitis. This helps to prevent regrowth of the hair. HOME CARE INSTRUCTIONS  Apply warm compresses to the affected areas as directed by your caregiver.  If antibiotics are prescribed,  take them as directed. Finish them even if you start to feel better.  You may take over-the-counter medicines to relieve itching.  Do not shave irritated skin.  Follow up with your caregiver as directed. SEEK IMMEDIATE MEDICAL CARE IF:   You have increasing redness, swelling, or pain in the affected area.  You have a fever. MAKE SURE YOU:  Understand these instructions.  Will watch your condition.  Will get help right away if you are not doing well or get worse. Document Released: 09/30/2001 Document Revised: 01/21/2012 Document Reviewed: 10/22/2011 [facility] Patient Information 2014 [place], [place].

## 2013-05-18 ENCOUNTER — Encounter: Payer: Self-pay | Admitting: Obstetrics & Gynecology

## 2013-05-24 ENCOUNTER — Encounter: Payer: Self-pay | Admitting: Obstetrics & Gynecology

## 2013-08-06 ENCOUNTER — Encounter (HOSPITAL_COMMUNITY): Payer: Self-pay | Admitting: *Deleted

## 2013-08-06 ENCOUNTER — Inpatient Hospital Stay (HOSPITAL_COMMUNITY)
Admission: AD | Admit: 2013-08-06 | Discharge: 2013-08-06 | Disposition: A | Discharge status: Home / Self Care | Payer: Managed Care, Other (non HMO) | Source: Ambulatory Visit | Attending: Obstetrics and Gynecology | Admitting: Obstetrics and Gynecology

## 2013-08-06 DIAGNOSIS — B373 Candidiasis of vulva and vagina: Secondary | ICD-10-CM

## 2013-08-06 DIAGNOSIS — N898 Other specified noninflammatory disorders of vagina: Secondary | ICD-10-CM | POA: Insufficient documentation

## 2013-08-06 DIAGNOSIS — B3731 Acute candidiasis of vulva and vagina: Secondary | ICD-10-CM | POA: Insufficient documentation

## 2013-08-06 LAB — URINALYSIS, ROUTINE W REFLEX MICROSCOPIC
BILIRUBIN URINE: NEGATIVE
Glucose, UA: NEGATIVE mg/dL
HGB URINE DIPSTICK: NEGATIVE
Ketones, ur: 15 mg/dL — AB
Nitrite: NEGATIVE
PROTEIN: NEGATIVE mg/dL
Specific Gravity, Urine: >1.03 — ABNORMAL HIGH (ref 1.005–1.030)
UROBILINOGEN UA: 0.2 mg/dL (ref 0.0–1.0)
pH: 5.5 (ref 5.0–8.0)

## 2013-08-06 LAB — URINE MICROSCOPIC-ADD ON

## 2013-08-06 LAB — WET PREP, GENITAL
CLUE CELLS WET PREP: NONE SEEN
Trich, Wet Prep: NONE SEEN

## 2013-08-06 LAB — POCT PREGNANCY, URINE: Preg Test, Ur: NEGATIVE

## 2013-08-06 MED ORDER — TRIAMCINOLONE ACETONIDE 0.1 % EX CREA: 1.0000 "application " | TOPICAL_CREAM | Freq: Two times a day (BID) | CUTANEOUS | Status: AC

## 2013-08-06 MED ORDER — FLUCONAZOLE 150 MG PO TABS
150.0000 mg | ORAL_TABLET | Freq: Once | ORAL | Status: AC
  Administered 2013-08-06: 150 mg via ORAL

## 2013-08-06 MED ORDER — FLUCONAZOLE 150 MG PO TABS
ORAL_TABLET | ORAL | Status: AC
  Filled 2013-08-06: qty 1

## 2013-08-06 MED ORDER — NYSTATIN 100000 UNIT/GM EX CREA: 1.0000 "application " | TOPICAL_CREAM | Freq: Two times a day (BID) | CUTANEOUS | Status: AC

## 2013-08-06 MED ORDER — FLUCONAZOLE 150 MG PO TABS: 150.0000 mg | ORAL_TABLET | Freq: Once | ORAL | Status: AC

## 2013-08-06 NOTE — MAU Note (Signed)
Urgency with need to urinate, hurts when she holds it.  Vaginal area is irritated, painful when anything touches. D/c has been changing.

## 2013-08-06 NOTE — MAU Provider Note (Signed)
Attestation of Attending Supervision of Advanced Practitioner (CNM/NP): Evaluation and management procedures were performed by the Advanced Practitioner under my supervision and collaboration.  I have reviewed the Advanced Practitioner's note and chart, and I agree with the management and plan.  Tekeisha Hakim 08/06/2013 10:37 PM   

## 2013-08-06 NOTE — MAU Provider Note (Signed)
History     CSN: [id number]  Arrival date and time: 08/06/13 1709   First Provider Initiated Contact with Patient 08/06/13 1751      Chief Complaint  Patient presents with  . urinary urgency   . Vaginal Discharge   HPI 28 y.o. G1P0101 with vaginal discharge x 1 week, severe external vaginal irritation x 2 days. Tried OTC cream, caused lots of burning. No pelvic pain.   Past Medical History  Diagnosis Date  . 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  Last one age of 61.  Marland Kitchen Headache(784.0)   . Hemorrhoids   . Heart murmur     no longer active.   . Breast abscess     right breast  . Bacterial vaginosis     Past Surgical History  Procedure Laterality Date  . Wisdom tooth extraction    . Incise and drain abcess      right breast    Family History  Problem Relation Age of Onset  . Asthma Mother   . Asthma Sister     History  Substance Use Topics  . Smoking status: Never Smoker   . Smokeless tobacco: Not on file  . Alcohol Use: 0.5 oz/week    1 drink(s) per week     Comment: occasionally    Allergies: No Known Allergies  Prescriptions prior to admission  Medication Sig Dispense Refill  . [DISCONTINUED] fluconazole (DIFLUCAN) 150 MG tablet Take 1 tablet (150 mg total) by mouth every 3 (three) days.  2 tablet  0    Review of Systems  Constitutional: Negative.   Respiratory: Negative.   Cardiovascular: Negative.   Gastrointestinal: Negative for nausea, vomiting, abdominal pain, diarrhea and constipation.  Genitourinary: Positive for dysuria (external burning with urination). Negative for urgency, frequency, hematuria and flank pain.       Negative for vaginal bleeding, + vaginal discharge   Musculoskeletal: Negative.   Neurological: Negative.   Psychiatric/Behavioral: Negative.    Physical Exam   Blood pressure 133/74, pulse 64, temperature 98.3 F (36.8 C), temperature source Oral, resp. rate 18, height 5\' 6"  (1.676 m), weight 190 lb (86.183 kg), last menstrual period  07/13/2013.  Physical Exam  Nursing note and vitals reviewed. Constitutional: She is oriented to person, place, and time. She appears well-developed and well-nourished. No distress.  HENT:  Head: Normocephalic and atraumatic.  Cardiovascular: Normal rate, regular rhythm and normal heart sounds.   Respiratory: Effort normal and breath sounds normal. No respiratory distress.  GI: Soft. Bowel sounds are normal. She exhibits no distension and no mass. There is no tenderness. There is no rebound and no guarding.  Genitourinary: There is tenderness (inflammation) on the right labia. There is no rash or lesion on the right labia. There is tenderness (inflammation) on the left labia. There is no rash or lesion on the left labia. There is erythema around the vagina. No tenderness or bleeding around the vagina. Vaginal discharge (curdy) found.  Neurological: She is alert and oriented to person, place, and time.  Skin: Skin is warm and dry.  Psychiatric: She has a normal mood and affect.    MAU Course  Procedures  Results for orders placed during the hospital encounter of 08/06/13 (from the past 24 hour(s))  POCT PREGNANCY, URINE     Status: None   Collection Time    08/06/13  5:37 PM      Result Value Range   Preg Test, Ur NEGATIVE  NEGATIVE  URINALYSIS, ROUTINE  W REFLEX MICROSCOPIC     Status: Abnormal   Collection Time    08/06/13  5:43 PM      Result Value Range   Color, Urine YELLOW  YELLOW   APPearance CLEAR  CLEAR   Specific Gravity, Urine >1.030 (*) 1.005 - 1.030   pH 5.5  5.0 - 8.0   Glucose, UA NEGATIVE  NEGATIVE mg/dL   Hgb urine dipstick NEGATIVE  NEGATIVE   Bilirubin Urine NEGATIVE  NEGATIVE   Ketones, ur 15 (*) NEGATIVE mg/dL   Protein, ur NEGATIVE  NEGATIVE mg/dL   Urobilinogen, UA 0.2  0.0 - 1.0 mg/dL   Nitrite NEGATIVE  NEGATIVE   Leukocytes, UA SMALL (*) NEGATIVE  URINE MICROSCOPIC-ADD ON     Status: Abnormal   Collection Time    08/06/13  5:43 PM      Result Value  Range   Squamous Epithelial / LPF FEW (*) RARE   WBC, UA 3-6  <3 WBC/hpf   Bacteria, UA FEW (*) RARE   Urine-Other RARE YEAST    WET PREP, GENITAL     Status: Abnormal   Collection Time    08/06/13  5:55 PM      Result Value Range   Yeast Wet Prep HPF POC MODERATE (*) NONE SEEN   Trich, Wet Prep NONE SEEN  NONE SEEN   Clue Cells Wet Prep HPF POC NONE SEEN  NONE SEEN   WBC, Wet Prep HPF POC MANY (*) NONE SEEN     Assessment and Plan   1. Vulvovaginal candidiasis       Medication List         fluconazole 150 MG tablet  Commonly known as:  DIFLUCAN  Take 1 tablet (150 mg total) by mouth once. Take on 08/09/2012     nystatin cream  Commonly known as:  MYCOSTATIN  Apply 1 application topically 2 (two) times daily.     triamcinolone cream 0.1 %  Commonly known as:  KENALOG  Apply 1 application topically 2 (two) times daily.        Follow-up Information   [facility] (As needed)    Specialty:  Obstetrics and Gynecology   Contact information:   [address] [place] [address] [phone]        [facility] Hospital[facility]RAZIER,Hellen Shanley 08/06/2013, 6:23 PM

## 2013-08-07 LAB — URINE CULTURE

## 2013-08-08 LAB — GC/CHLAMYDIA PROBE AMP
CT PROBE, AMP APTIMA: NEGATIVE
GC Probe RNA: NEGATIVE

## 2013-11-04 ENCOUNTER — Institutional Professional Consult (permissible substitution): Payer: Managed Care, Other (non HMO) | Admitting: Obstetrics & Gynecology

## 2013-12-16 ENCOUNTER — Ambulatory Visit: Payer: Managed Care, Other (non HMO) | Admitting: Obstetrics & Gynecology

## 2014-06-06 ENCOUNTER — Encounter (HOSPITAL_COMMUNITY): Payer: Self-pay | Admitting: *Deleted

## 2014-08-01 ENCOUNTER — Encounter: Payer: Self-pay | Admitting: *Deleted

## 2014-08-02 ENCOUNTER — Encounter: Payer: Self-pay | Admitting: Obstetrics & Gynecology

## 2016-03-23 DIAGNOSIS — Z0000 Encounter for general adult medical examination without abnormal findings: Secondary | ICD-10-CM | POA: Diagnosis not present

## 2016-03-23 DIAGNOSIS — Z01419 Encounter for gynecological examination (general) (routine) without abnormal findings: Secondary | ICD-10-CM | POA: Diagnosis not present

## 2016-03-23 DIAGNOSIS — N760 Acute vaginitis: Secondary | ICD-10-CM | POA: Diagnosis not present

## 2016-03-23 DIAGNOSIS — B9689 Other specified bacterial agents as the cause of diseases classified elsewhere: Secondary | ICD-10-CM | POA: Diagnosis not present

## 2016-08-24 DIAGNOSIS — G44209 Tension-type headache, unspecified, not intractable: Secondary | ICD-10-CM | POA: Diagnosis not present

## 2016-08-24 DIAGNOSIS — B9689 Other specified bacterial agents as the cause of diseases classified elsewhere: Secondary | ICD-10-CM | POA: Diagnosis not present

## 2016-08-24 DIAGNOSIS — N898 Other specified noninflammatory disorders of vagina: Secondary | ICD-10-CM | POA: Diagnosis not present

## 2016-08-24 DIAGNOSIS — R002 Palpitations: Secondary | ICD-10-CM | POA: Diagnosis not present

## 2016-08-24 DIAGNOSIS — N760 Acute vaginitis: Secondary | ICD-10-CM | POA: Diagnosis not present

## 2017-05-06 DIAGNOSIS — Z118 Encounter for screening for other infectious and parasitic diseases: Secondary | ICD-10-CM | POA: Diagnosis not present

## 2017-05-06 DIAGNOSIS — B373 Candidiasis of vulva and vagina: Secondary | ICD-10-CM | POA: Diagnosis not present

## 2017-05-06 DIAGNOSIS — Z6826 Body mass index (BMI) 26.0-26.9, adult: Secondary | ICD-10-CM | POA: Diagnosis not present

## 2017-05-06 DIAGNOSIS — Z3046 Encounter for surveillance of implantable subdermal contraceptive: Secondary | ICD-10-CM | POA: Diagnosis not present

## 2017-05-06 DIAGNOSIS — Z1151 Encounter for screening for human papillomavirus (HPV): Secondary | ICD-10-CM | POA: Diagnosis not present

## 2017-05-06 DIAGNOSIS — Z01419 Encounter for gynecological examination (general) (routine) without abnormal findings: Secondary | ICD-10-CM | POA: Diagnosis not present

## 2017-05-06 DIAGNOSIS — Z3202 Encounter for pregnancy test, result negative: Secondary | ICD-10-CM | POA: Diagnosis not present

## 2017-05-10 DIAGNOSIS — H52223 Regular astigmatism, bilateral: Secondary | ICD-10-CM | POA: Diagnosis not present

## 2017-06-25 DIAGNOSIS — Z118 Encounter for screening for other infectious and parasitic diseases: Secondary | ICD-10-CM | POA: Diagnosis not present

## 2017-06-25 DIAGNOSIS — N9089 Other specified noninflammatory disorders of vulva and perineum: Secondary | ICD-10-CM | POA: Diagnosis not present

## 2017-06-25 DIAGNOSIS — N898 Other specified noninflammatory disorders of vagina: Secondary | ICD-10-CM | POA: Diagnosis not present

## 2017-10-24 DIAGNOSIS — N898 Other specified noninflammatory disorders of vagina: Secondary | ICD-10-CM | POA: Diagnosis not present

## 2017-10-24 DIAGNOSIS — J351 Hypertrophy of tonsils: Secondary | ICD-10-CM | POA: Diagnosis not present

## 2018-05-04 DIAGNOSIS — R399 Unspecified symptoms and signs involving the genitourinary system: Secondary | ICD-10-CM | POA: Diagnosis not present

## 2018-05-04 DIAGNOSIS — N898 Other specified noninflammatory disorders of vagina: Secondary | ICD-10-CM | POA: Diagnosis not present

## 2018-05-04 DIAGNOSIS — R82998 Other abnormal findings in urine: Secondary | ICD-10-CM | POA: Diagnosis not present

## 2018-05-04 DIAGNOSIS — Z113 Encounter for screening for infections with a predominantly sexual mode of transmission: Secondary | ICD-10-CM | POA: Diagnosis not present

## 2018-08-29 DIAGNOSIS — H52223 Regular astigmatism, bilateral: Secondary | ICD-10-CM | POA: Diagnosis not present

## 2018-09-25 DIAGNOSIS — Z113 Encounter for screening for infections with a predominantly sexual mode of transmission: Secondary | ICD-10-CM | POA: Diagnosis not present

## 2018-09-25 DIAGNOSIS — N72 Inflammatory disease of cervix uteri: Secondary | ICD-10-CM | POA: Diagnosis not present

## 2020-07-26 ENCOUNTER — Other Ambulatory Visit: Payer: Self-pay | Admitting: Obstetrics and Gynecology

## 2020-07-26 DIAGNOSIS — N63 Unspecified lump in unspecified breast: Secondary | ICD-10-CM

## 2020-08-02 ENCOUNTER — Ambulatory Visit
Admission: RE | Admit: 2020-08-02 | Discharge: 2020-08-02 | Disposition: A | Discharge status: Home / Self Care | Payer: PRIVATE HEALTH INSURANCE | Source: Ambulatory Visit | Attending: Obstetrics and Gynecology | Admitting: Obstetrics and Gynecology

## 2020-08-02 ENCOUNTER — Ambulatory Visit
Admission: RE | Admit: 2020-08-02 | Discharge: 2020-08-02 | Disposition: A | Discharge status: Home / Self Care | Payer: Managed Care, Other (non HMO) | Source: Ambulatory Visit | Attending: Obstetrics and Gynecology | Admitting: Obstetrics and Gynecology

## 2020-08-02 ENCOUNTER — Other Ambulatory Visit: Payer: Self-pay

## 2020-08-02 DIAGNOSIS — N63 Unspecified lump in unspecified breast: Secondary | ICD-10-CM

## 2022-03-24 IMAGING — US US BREAST*R* LIMITED INC AXILLA
1 series · 3 of 3 positions shown · non-contrast
Comparison: None.

CLINICAL DATA: 34-year-old female with recurrent infection along
the LOWER RIGHT breast. Currently no erythema, fever or pain.
Baseline mammogram.

EXAM:
DIGITAL DIAGNOSTIC BILATERAL MAMMOGRAM WITH CAD AND TOMO
ULTRASOUND RIGHT BREAST

[Series 1: us breast*right* limited inc axilla · 0.06mm/px · 3 of 3 slices shown]
[im 1/3]
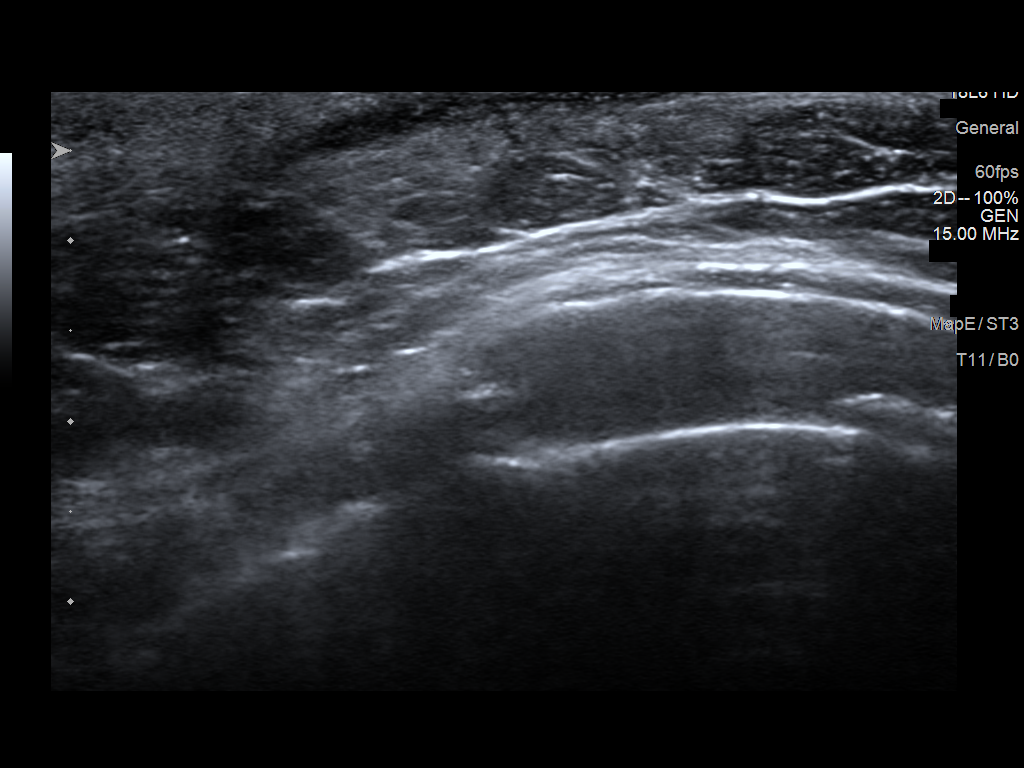
[im 2/3]
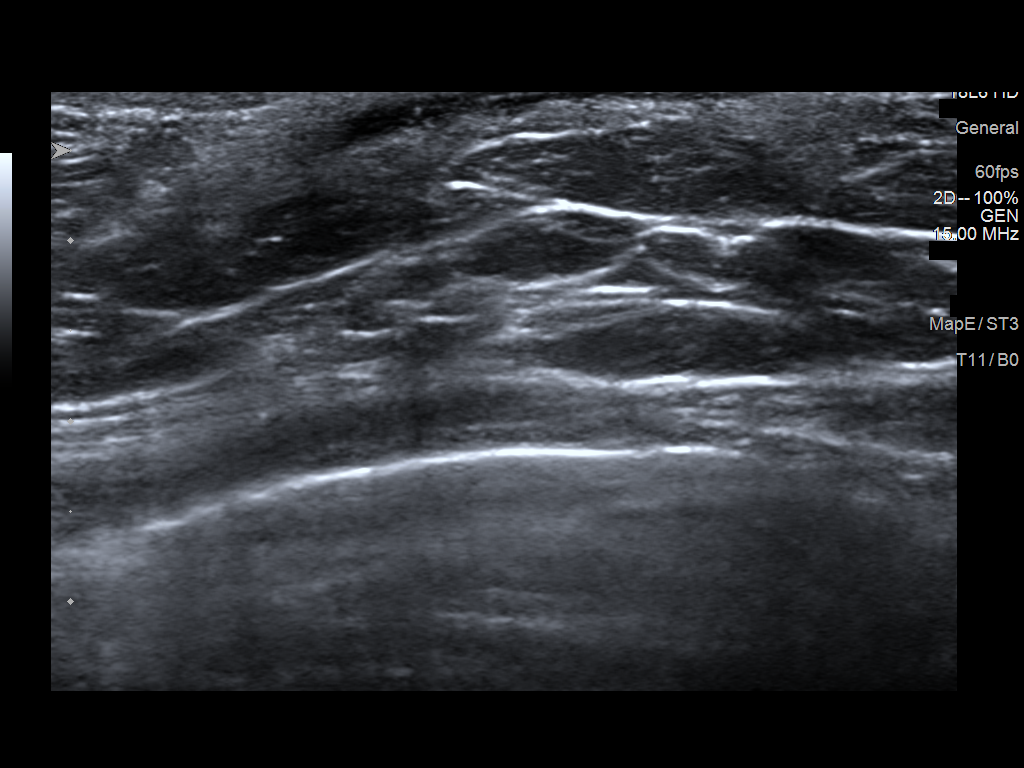
[im 3/3]
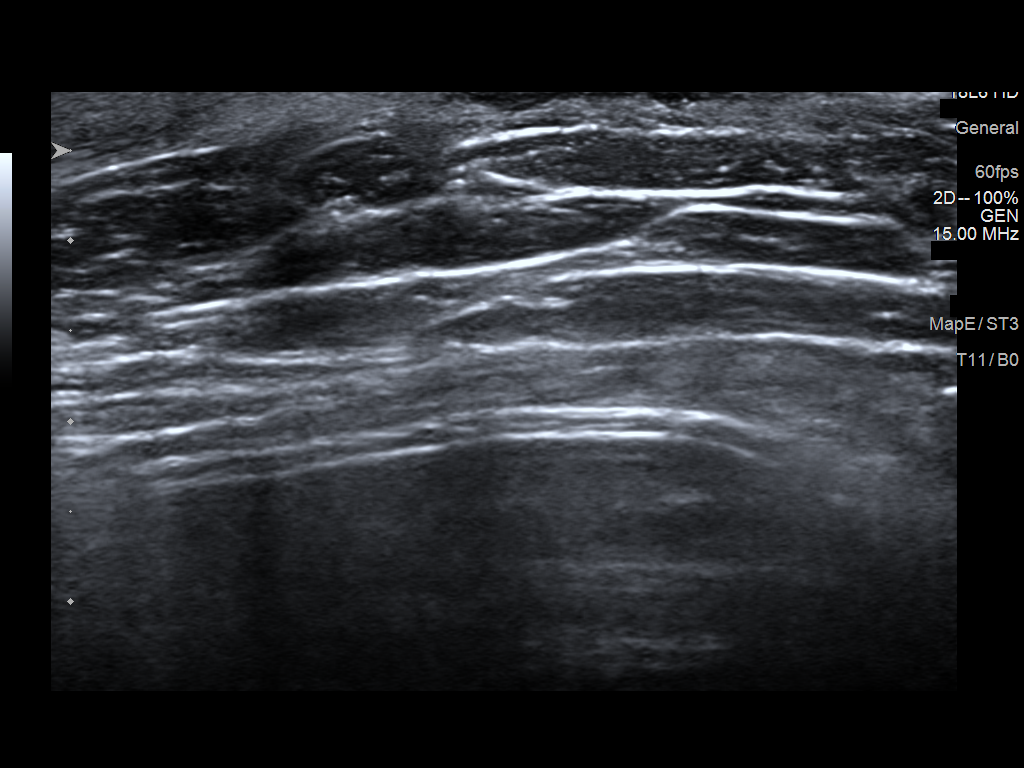

[3 of 3 positions shown; findings below may reference images not displayed]

ACR Breast Density Category b: There are scattered areas of
fibroglandular density.
FINDINGS: 2D/3D full field views of both breasts demonstrate no suspicious
mass, distortion or worrisome calcifications.

Mammographic images were processed with CAD.

On physical exam, mild thickening along the LOWER RIGHT breast along
the inframammary fold noted. No erythema identified.

Targeted ultrasound is performed, showing a tiny sliver of fluid
underlying the skin within the LOWER INNER RIGHT breast. No discrete
mass, distortion or abnormal shadowing identified within the LOWER
RIGHT breast.
IMPRESSION: 1. Tiny sliver of fluid underlying the skin in the LOWER INNER RIGHT
breast, likely from prior inflammation/infection.
2. No mammographic evidence of breast malignancy.

RECOMMENDATION:
Consider clinical follow-up as indicated. Any further workup should
be based on clinical grounds.

Bilateral screening mammogram at age 39.

I have discussed the findings and recommendations with the patient.
If applicable, a reminder letter will be sent to the patient
regarding the next appointment.

BI-RADS CATEGORY  2: Benign.

## 2023-03-12 ENCOUNTER — Emergency Department (HOSPITAL_COMMUNITY)
Admission: EM | Admit: 2023-03-12 | Discharge: 2023-03-12 | Disposition: A | Discharge status: Home / Self Care | Payer: 59 | Attending: Emergency Medicine | Admitting: Emergency Medicine

## 2023-03-12 ENCOUNTER — Encounter (HOSPITAL_COMMUNITY): Payer: Self-pay | Admitting: Emergency Medicine

## 2023-03-12 ENCOUNTER — Other Ambulatory Visit: Payer: Self-pay

## 2023-03-12 DIAGNOSIS — M549 Dorsalgia, unspecified: Secondary | ICD-10-CM | POA: Diagnosis present

## 2023-03-12 DIAGNOSIS — M79604 Pain in right leg: Secondary | ICD-10-CM | POA: Diagnosis not present

## 2023-03-12 DIAGNOSIS — M5441 Lumbago with sciatica, right side: Secondary | ICD-10-CM

## 2023-03-12 DIAGNOSIS — G8929 Other chronic pain: Secondary | ICD-10-CM | POA: Insufficient documentation

## 2023-03-12 MED ORDER — METHYLPREDNISOLONE 4 MG PO TBPK: ORAL_TABLET | ORAL | 0 refills | Status: AC

## 2023-03-12 MED ORDER — KETOROLAC TROMETHAMINE 30 MG/ML IJ SOLN
30.0000 mg | Freq: Once | INTRAMUSCULAR | Status: AC
  Administered 2023-03-12: 30 mg via INTRAMUSCULAR
  Filled 2023-03-12: qty 1

## 2023-03-12 MED ORDER — ACETAMINOPHEN 500 MG PO TABS
1000.0000 mg | ORAL_TABLET | Freq: Once | ORAL | Status: AC
  Administered 2023-03-12: 1000 mg via ORAL
  Filled 2023-03-12: qty 2

## 2023-03-12 NOTE — ED Triage Notes (Signed)
Pt reports posterior right leg and back pain that runs down behind her knee. Pt reports doing stretches and taking OTC medication with no relief.

## 2023-03-12 NOTE — Discharge Instructions (Signed)
You can take 1000 mg of Tylenol every 8 hours.  You can take Medrol Dosepak as instructed by the packaging.

## 2023-03-12 NOTE — ED Provider Notes (Signed)
[place] EMERGENCY DEPARTMENT AT [facility] Provider Note   CSN: [id number] Arrival date & time: 03/12/23  1025     History  Chief Complaint  Patient presents with   Leg Pain    Jean Martin is a 37 y.o. female.  This is a 37 year old female here today for acute on chronic back pain.  Patient says that she has had pain in her back and right leg for 1 year.  She has been seeing a chiropractor for this intermittently over the last 1 year, has been doing stretches.  She says that yesterday her pain gradually worsened.  She has not followed with a primary care doctor or specialist.   Leg Pain      Home Medications Prior to Admission medications   Medication Sig Start Date End Date Taking? Authorizing Provider  fluconazole (DIFLUCAN) 150 MG tablet Take 1 tablet (150 mg total) by mouth once. Take on 08/09/2012 08/06/13   Archie Patten, CNM  nystatin cream (MYCOSTATIN) Apply 1 application topically 2 (two) times daily. 08/06/13   Archie Patten, CNM  triamcinolone cream (KENALOG) 0.1 % Apply 1 application topically 2 (two) times daily. 08/06/13   Archie Patten, CNM      Allergies    Patient has no known allergies.    Review of Systems   Review of Systems  Physical Exam Updated Vital Signs BP (!) 139/93 (BP Location: Right Arm)   Pulse 85   Temp 99.1 F (37.3 C) (Oral)   Resp 16   SpO2 97%  Physical Exam Vitals reviewed.  Constitutional:      Appearance: Normal appearance.  Musculoskeletal:        General: No swelling or deformity. Normal range of motion.  Neurological:     General: No focal deficit present.     Mental Status: She is alert.     Motor: No weakness.     Gait: Gait normal.     ED Results / Procedures / Treatments   Labs (all labs ordered are listed, but only abnormal results are displayed) Labs Reviewed - No data to display  EKG None  Radiology No results found.  Procedures Procedures    Medications Ordered in  ED Medications  acetaminophen (TYLENOL) tablet 1,000 mg (has no administration in time range)  ketorolac (TORADOL) 30 MG/ML injection 30 mg (has no administration in time range)    ED Course/ Medical Decision Making/ A&P                                 Medical Decision Making 37 year old female here today for worsening of chronic back pain.  Differential diagnoses include chronic musculoskeletal back pain, less likely cord compression, less likely infectious process, less likely nephrolithiasis, less likely pyelonephritis.  Plan-patient's story and exam is consistent with musculoskeletal back pain.  Patient has tried conservative measures at home without significant improvement.  Will present patient with some analgesia here in the emergency department.  Will discharge patient home with short course of steroids.  Will provide referral.  Patient will also establish care with a primary care doctor.  Risk OTC drugs. Prescription drug management.           Final Clinical Impression(s) / ED Diagnoses Final diagnoses:  None    Rx / DC Orders ED Discharge Orders     None         Arletha Pili,  DO 03/12/23 1120
